# Patient Record
Sex: Female | Born: 1975 | Race: Black or African American | Hispanic: No | Marital: Married | Smoking: Former smoker
Health system: Southern US, Community
[De-identification: ages and names within clinical notes are randomized; demographics above are authoritative.]

## PROBLEM LIST (undated history)

## (undated) DIAGNOSIS — I10 Essential (primary) hypertension: Secondary | ICD-10-CM

## (undated) DIAGNOSIS — M797 Fibromyalgia: Secondary | ICD-10-CM

## (undated) DIAGNOSIS — M1990 Unspecified osteoarthritis, unspecified site: Secondary | ICD-10-CM

## (undated) HISTORY — PX: CHOLECYSTECTOMY: SHX55

## (undated) HISTORY — PX: APPENDECTOMY: SHX54

---

## 2019-02-12 ENCOUNTER — Other Ambulatory Visit: Payer: Self-pay

## 2019-02-12 ENCOUNTER — Emergency Department (HOSPITAL_COMMUNITY): Payer: BLUE CROSS/BLUE SHIELD

## 2019-02-12 ENCOUNTER — Emergency Department (HOSPITAL_BASED_OUTPATIENT_CLINIC_OR_DEPARTMENT_OTHER): Payer: BLUE CROSS/BLUE SHIELD

## 2019-02-12 ENCOUNTER — Encounter (HOSPITAL_COMMUNITY): Payer: Self-pay | Admitting: Oncology

## 2019-02-12 ENCOUNTER — Emergency Department (HOSPITAL_COMMUNITY)
Admission: EM | Admit: 2019-02-12 | Discharge: 2019-02-12 | Disposition: A | Discharge status: Home / Self Care | Payer: BLUE CROSS/BLUE SHIELD | Attending: Emergency Medicine | Admitting: Emergency Medicine

## 2019-02-12 DIAGNOSIS — M79605 Pain in left leg: Secondary | ICD-10-CM | POA: Diagnosis present

## 2019-02-12 DIAGNOSIS — Z79899 Other long term (current) drug therapy: Secondary | ICD-10-CM | POA: Diagnosis not present

## 2019-02-12 DIAGNOSIS — I10 Essential (primary) hypertension: Secondary | ICD-10-CM | POA: Insufficient documentation

## 2019-02-12 DIAGNOSIS — M79609 Pain in unspecified limb: Secondary | ICD-10-CM

## 2019-02-12 DIAGNOSIS — M25561 Pain in right knee: Secondary | ICD-10-CM | POA: Insufficient documentation

## 2019-02-12 DIAGNOSIS — M25562 Pain in left knee: Secondary | ICD-10-CM

## 2019-02-12 DIAGNOSIS — Z87891 Personal history of nicotine dependence: Secondary | ICD-10-CM | POA: Diagnosis not present

## 2019-02-12 HISTORY — DX: Fibromyalgia: M79.7

## 2019-02-12 HISTORY — DX: Essential (primary) hypertension: I10

## 2019-02-12 HISTORY — DX: Unspecified osteoarthritis, unspecified site: M19.90

## 2019-02-12 MED ORDER — DICLOFENAC SODIUM 1 % TD GEL: 2.0000 g | Freq: Four times a day (QID) | TRANSDERMAL | 0 refills | Status: AC

## 2019-02-12 MED ORDER — KETOROLAC TROMETHAMINE 15 MG/ML IJ SOLN
15.0000 mg | Freq: Once | INTRAMUSCULAR | Status: AC
  Administered 2019-02-12: 15 mg via INTRAMUSCULAR
  Filled 2019-02-12: qty 1

## 2019-02-12 NOTE — Progress Notes (Signed)
VASCULAR LAB PRELIMINARY  PRELIMINARY  PRELIMINARY  PRELIMINARY  Left lower extremity venous duplex completed.    Preliminary report:  See CV proc for preliminary results.  Gave report to [facility], PA-C  Yorel Redder, RVT 02/12/2019, 8:50 AM

## 2019-02-12 NOTE — ED Triage Notes (Signed)
Pt c/o left leg pain that began yesterday.  States it started in her left knee and began to radiate down. Pt states the pain woke her up from sleep and is effecting entire left leg.  Denies injury.

## 2019-02-12 NOTE — ED Notes (Signed)
Patient transported to X-ray 

## 2019-02-12 NOTE — ED Provider Notes (Signed)
[facility] Provider Note   CSN: [id number] Arrival date & time: 02/12/19  0631    History   Chief Complaint Chief Complaint  Patient presents with  . Leg Pain    HPI Sara Howard is a 43 y.o. female presenting for evaluation of left leg pain.  Patient states she was walking yesterday when she felt her knee slipped on itself.  She had acute onset left knee pain, which radiates down into her foot and up into her leg.  Pain is constant and severe.  She took her home Tylenol for arthritis with minimal improvement of her symptoms.  She has not taken anything else.  She denies twisting motion.  Denies numbness or tingling.  She has a history of arthritis in both knees, followed by orthopedics.  She has not had any knee surgeries.  She denies fevers, swelling, or warmth of the knee.  She has a history of fibromyalgia, but states she does not take anything for this.  Has a history of hypertension, is not on any medication for this.  She reports no other medical problems.  She denies recent travel, surgeries, immobilization, history of cancer, history of previous DVT/PE, or hormone use.     HPI  Past Medical History:  Diagnosis Date  . Arthritis    b/l knees  . Fibromyalgia   . Hypertension     There are no active problems to display for this patient.   Past Surgical History:  Procedure Laterality Date  . APPENDECTOMY    . CESAREAN SECTION     x 2  . CHOLECYSTECTOMY       OB History   No obstetric history on file.      Home Medications    Prior to Admission medications   Medication Sig Start Date End Date Taking? Authorizing Provider  acetaminophen (TYLENOL 8 HOUR ARTHRITIS PAIN) 650 MG CR tablet Take 1,300 mg by mouth every 8 (eight) hours as needed for pain.   Yes [provider]  hydrochlorothiazide (HYDRODIURIL) 25 MG tablet Take 25 mg by mouth daily.   Yes [provider]  diclofenac sodium (VOLTAREN) 1 %  GEL Apply 2 g topically 4 (four) times daily. 02/12/19   Nazaret Chea, PA-C    Family History No family history on file.  Social History Social History   Tobacco Use  . Smoking status: Former Games developermoker  . Smokeless tobacco: Never Used  Substance Use Topics  . Alcohol use: Not Currently  . Drug use: Not Currently     Allergies   Ancef [cefazolin] and Flagyl [metronidazole]   Review of Systems Review of Systems  Musculoskeletal: Positive for arthralgias and myalgias.  All other systems reviewed and are negative.    Physical Exam Updated Vital Signs BP 126/71   Pulse 85   Temp 97.8 F (36.6 C) (Oral) Comment: Simultaneous filing. User may not have seen previous data. Comment (Src): Simultaneous filing. User may not have seen previous data.  Resp (!) 24   Ht 5\' 6"  (1.676 m)   Wt 127 kg   LMP 01/22/2019 (Approximate)   SpO2 98%   BMI 45.19 kg/m   Physical Exam Vitals signs and nursing note reviewed.  Constitutional:      General: She is not in acute distress.    Appearance: She is well-developed.     Comments: Obese female resting in the bed in no acute distress  HENT:     Head: Normocephalic and atraumatic.  Eyes:     Conjunctiva/sclera: Conjunctivae normal.     Pupils: Pupils are equal, round, and reactive to light.  Neck:     Musculoskeletal: Normal range of motion and neck supple.  Cardiovascular:     Rate and Rhythm: Normal rate and regular rhythm.  Pulmonary:     Effort: Pulmonary effort is normal. No respiratory distress.     Breath sounds: Normal breath sounds. No wheezing.  Abdominal:     General: There is no distension.     Palpations: Abdomen is soft. There is no mass.     Tenderness: There is no abdominal tenderness. There is no guarding or rebound.  Musculoskeletal: Normal range of motion.        General: Tenderness present.     Comments: No significant swelling noted of the left knee.  No erythema or warmth.  Tenderness palpation of the  calf, posterior knee, and posterior lateral upper leg.  Tenderness palpation along the medial joint line of the knee.  No tenderness palpation of the anterior leg.  Pedal pulses intact bilaterally.  Good distal cap refill.  Full active range of motion the ankle without pain.  No increased pain with flexion and extension of the left knee.  Increased pain with both varus and valgus stress.  No tenderness palpation of the hip.  Skin:    General: Skin is warm and dry.     Capillary Refill: Capillary refill takes less than 2 seconds.  Neurological:     Mental Status: She is alert and oriented to person, place, and time.      ED Treatments / Results  Labs (all labs ordered are listed, but only abnormal results are displayed) Labs Reviewed - No data to display  EKG None  Radiology Dg Knee Complete 4 Views Left  Result Date: 02/12/2019 CLINICAL DATA:  Pain EXAM: LEFT KNEE - COMPLETE 4+ VIEW COMPARISON:  None. FINDINGS: Frontal, lateral, and bilateral oblique views were obtained. There is no appreciable acute fracture or dislocation. No evident joint effusion. There is moderate narrowing medially and in the patellofemoral joint. There is mild spurring medially as well as off the superior anterior patella. No erosive change. IMPRESSION: Osteoarthritic change, most marked medially and in the patellofemoral joint. Spur along the anterior superior patella likely represents distal quadriceps tendinosis. No fracture or dislocation. No evident joint effusion. Electronically Signed   By: Bretta Bang III M.D.   On: 02/12/2019 08:08   Vas [place] Lower Extremity Venous (dvt) ([facility] And Wl 7a-7p)  Result Date: 02/12/2019  Lower Venous Study Indications: Knee pain.  Limitations: Body habitus. Comparison Study: No prior study on file for comparison Performing Technologist: Sherren Kerns RVS  Examination Guidelines: A complete evaluation includes B-mode imaging, spectral Doppler, color Doppler, and power Doppler as  needed of all accessible portions of each vessel. Bilateral testing is considered an integral part of a complete examination. Limited examinations for reoccurring indications may be performed as noted.  +---------+---------------+---------+-----------+----------+-------+ RIGHT    CompressibilityPhasicitySpontaneityPropertiesSummary +---------+---------------+---------+-----------+----------+-------+ CFV      Full           Yes      Yes                          +---------+---------------+---------+-----------+----------+-------+ SFJ      Full                                                 +---------+---------------+---------+-----------+----------+-------+  FV Prox  Full                                                 +---------+---------------+---------+-----------+----------+-------+ FV Mid   Full                                                 +---------+---------------+---------+-----------+----------+-------+ FV DistalFull                                                 +---------+---------------+---------+-----------+----------+-------+ PFV      Full                                                 +---------+---------------+---------+-----------+----------+-------+ POP      Full           Yes      Yes                          +---------+---------------+---------+-----------+----------+-------+ PTV      Full                                                 +---------+---------------+---------+-----------+----------+-------+ PERO     Full                                                 +---------+---------------+---------+-----------+----------+-------+   +----+---------------+---------+-----------+----------+-------+ LEFTCompressibilityPhasicitySpontaneityPropertiesSummary +----+---------------+---------+-----------+----------+-------+ CFV Full           Yes      Yes                           +----+---------------+---------+-----------+----------+-------+     Summary: Right: No evidence of common femoral vein obstruction. Left: There is no evidence of deep vein thrombosis in the lower extremity.  *See table(s) above for measurements and observations.    Preliminary     Procedures Procedures (including critical care time)  Medications Ordered in ED Medications  ketorolac (TORADOL) 15 MG/ML injection 15 mg (15 mg Intramuscular Given 02/12/19 0841)     Initial Impression / Assessment and Plan / ED Course  I have reviewed the triage vital signs and the nursing notes.  Pertinent labs & imaging results that were available during my care of the patient were reviewed by me and considered in my medical decision making (see chart for details).        Pt presenting for evaluation of left leg pain.  Physical exam reassuring, she is neurovascularly intact.  No fever, warmth, or swelling, doubt septic joint.  Likely isolated knee injury causing radiating pain, however as pain extends from the calf to the posterior upper  leg, consider possibility of a blood clot.  Will obtain ultrasound to rule out.  Will obtain x-ray for evaluation of the knee.  Toradol for pain.  X-ray viewed interpreted by me, no fracture or dislocation.  Does show extensive arthritis.  Ultrasound negative for DVT.  On reassessment, patient reports pain is improved with Toradol.  Discussed findings of arthritis, likely exacerbated by injury to the knee yesterday.  Encouraged continued treatment with Tylenol, will prescribe Voltaren gel.  Knee sleeve given for support.  Encourage follow-up with orthopedics for further evaluation as needed.  At this time, patient appears safe for discharge.  Return precautions given.  Patient states she understands agrees plan.   Final Clinical Impressions(s) / ED Diagnoses   Final diagnoses:  Acute pain of left knee    ED Discharge Orders         Ordered    diclofenac sodium  (VOLTAREN) 1 % GEL  4 times daily     02/12/19 0922           Alveria Apley, PA-C 02/12/19 1059    Margarita Grizzle, MD 02/12/19 1427

## 2019-02-12 NOTE — ED Notes (Signed)
Pt given discharge instructions and follow up information. Pt given the opportunity to ask questions. Pt verbalized understanding.  

## 2019-02-12 NOTE — Progress Notes (Signed)
Orthopedic Tech Progress Note Patient Details:  Sara Howard [birth year] [id number] I applied a knee sleeve to the Left knee but some reason when I click on the knee sleeve it will not allow me to charge for it. Ortho Devices Type of Ortho Device: Knee Sleeve Ortho Device/Splint Location: LLE Ortho Device/Splint Interventions: Adjustment, Application, Ordered   Post Interventions Patient Tolerated: Well Instructions Provided: Care of device, Adjustment of device   Donald Pore 02/12/2019, 9:58 AM

## 2019-02-12 NOTE — Discharge Instructions (Addendum)
Continue to take the Tylenol arthritis for pain control. Use Voltaren gel to help with pain and swelling. Use the knee sleeve when walking to help support your knee. Call your orthopedic doctor if your pain is not improving. Return to the emergency room with any new, worsening, concerning symptoms.

## 2020-11-29 IMAGING — CR LEFT KNEE - COMPLETE 4+ VIEW
4 series · 4 of 4 positions shown · non-contrast
Comparison: None.

CLINICAL DATA: Pain

EXAM:
LEFT KNEE - COMPLETE 4+ VIEW

[knee ap]
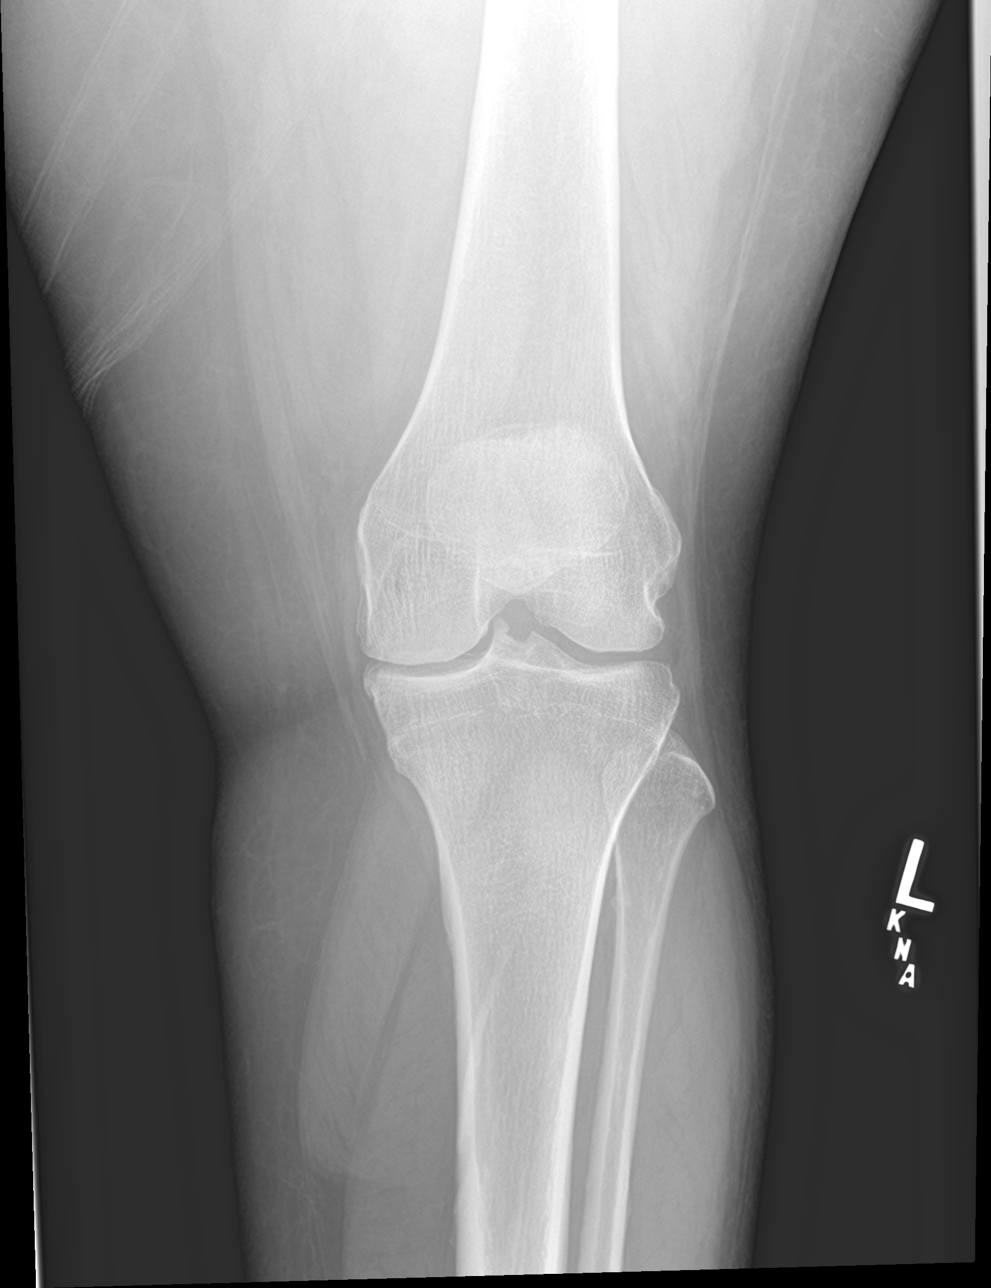

[knee lat]
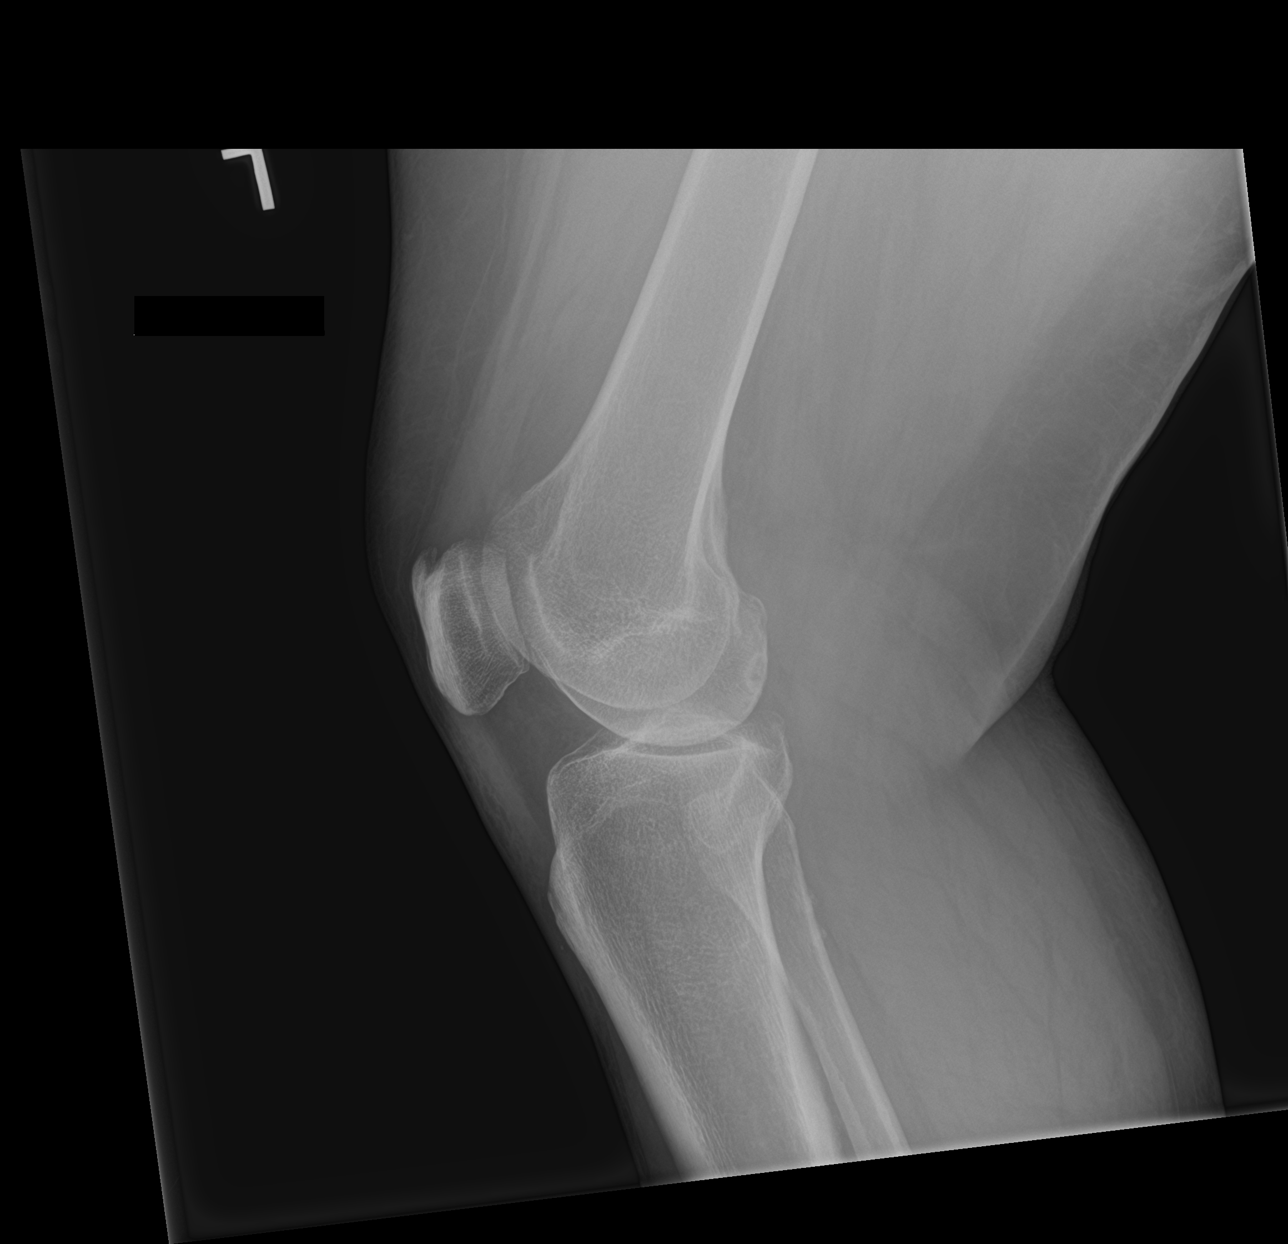

[knee obl (1 of 2)]
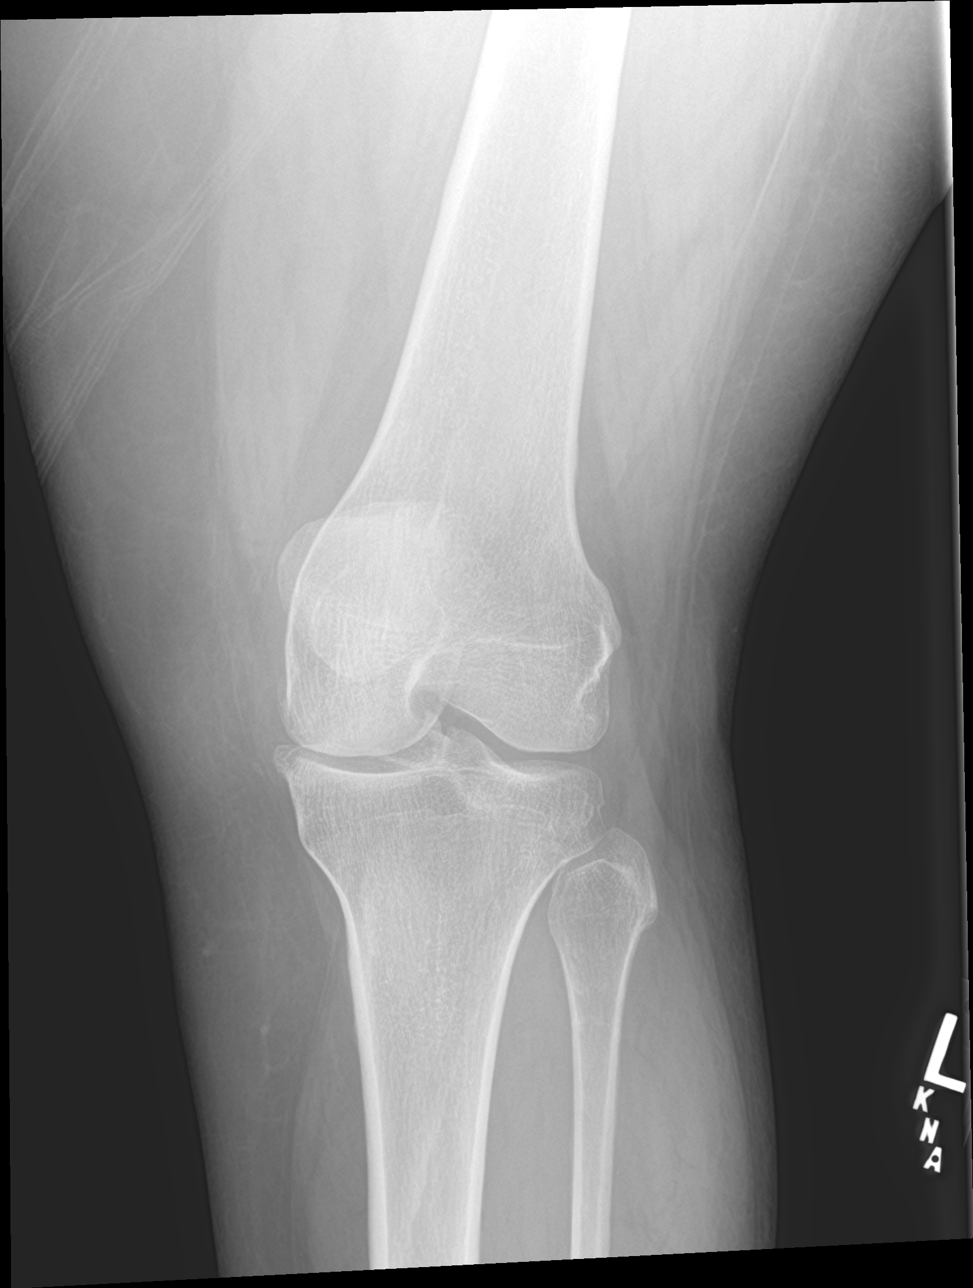

[knee obl (2 of 2)]
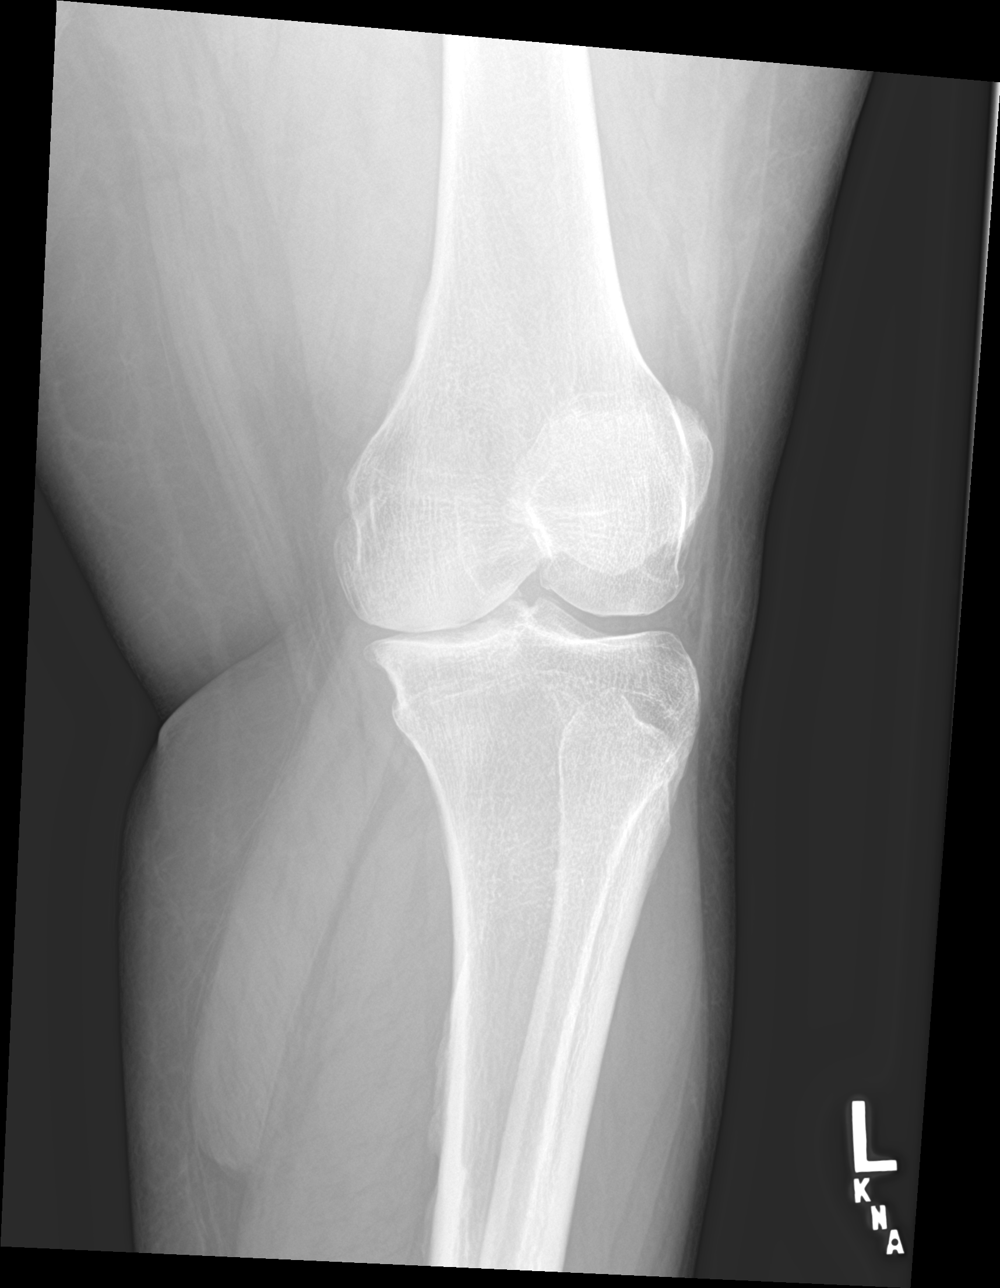

[4 of 4 positions shown; findings below may reference images not displayed]

FINDINGS: Frontal, lateral, and bilateral oblique views were obtained. There
is no appreciable acute fracture or dislocation. No evident joint
effusion. There is moderate narrowing medially and in the
patellofemoral joint. There is mild spurring medially as well as off
the superior anterior patella. No erosive change.
IMPRESSION: Osteoarthritic change, most marked medially and in the
patellofemoral joint. Spur along the anterior superior patella
likely represents distal quadriceps tendinosis. No fracture or
dislocation. No evident joint effusion.
